# Patient Record
Sex: Female | Born: 1940 | Race: White | Hispanic: No
Health system: Western US, Academic
[De-identification: ages and names within clinical notes are randomized; demographics above are authoritative.]

## PROBLEM LIST (undated history)

## (undated) DIAGNOSIS — E119 Type 2 diabetes mellitus without complications: Secondary | ICD-10-CM

---

## 2017-10-26 ENCOUNTER — Encounter: Payer: Self-pay | Admitting: Emergency Medicine

## 2017-10-26 ENCOUNTER — Emergency Department (EMERGENCY_DEPARTMENT_HOSPITAL): Payer: Medicare Other

## 2017-10-26 ENCOUNTER — Emergency Department
Admission: EM | Admit: 2017-10-26 | Discharge: 2017-10-26 | Disposition: A | Discharge status: Home / Self Care | Payer: Medicare Other | Attending: Emergency Medicine | Admitting: Emergency Medicine

## 2017-10-26 DIAGNOSIS — R05 Cough: Secondary | ICD-10-CM

## 2017-10-26 DIAGNOSIS — H05012 Cellulitis of left orbit: Secondary | ICD-10-CM

## 2017-10-26 DIAGNOSIS — I4510 Unspecified right bundle-branch block: Secondary | ICD-10-CM

## 2017-10-26 DIAGNOSIS — S0512XA Contusion of eyeball and orbital tissues, left eye, initial encounter: Secondary | ICD-10-CM

## 2017-10-26 DIAGNOSIS — B309 Viral conjunctivitis, unspecified: Secondary | ICD-10-CM

## 2017-10-26 DIAGNOSIS — R9431 Abnormal electrocardiogram [ECG] [EKG]: Secondary | ICD-10-CM

## 2017-10-26 DIAGNOSIS — H5713 Ocular pain, bilateral: Principal | ICD-10-CM | POA: Insufficient documentation

## 2017-10-26 DIAGNOSIS — E119 Type 2 diabetes mellitus without complications: Secondary | ICD-10-CM | POA: Insufficient documentation

## 2017-10-26 HISTORY — DX: Type 2 diabetes mellitus without complications: E11.9

## 2017-10-26 LAB — BASIC METABOLIC PANEL
CALCIUM: 9.1 mg/dL (ref 8.6–10.5)
CARBON DIOXIDE TOTAL: 23 mmol/L — AB (ref 24–32)
CHLORIDE: 97 mmol/L (ref 95–110)
CREATININE BLOOD: 0.9 mg/dL (ref 0.44–1.27)
GLUCOSE: 99 mg/dL (ref 70–99)
POTASSIUM: 3.8 mmol/L (ref 3.3–5.0)
SODIUM: 133 mmol/L — AB (ref 135–145)
UREA NITROGEN, BLOOD (BUN): 9 mg/dL (ref 8–22)

## 2017-10-26 LAB — CBC WITH DIFFERENTIAL
BASOPHILS % AUTO: 0.5 %
Basophils Abs Auto: 0 10*3/uL (ref 0.0–0.2)
EOSINOPHIL % AUTO: 1.9 %
EOSINOPHIL ABS AUTO: 0.1 10*3/uL (ref 0.0–0.5)
HEMATOCRIT: 40.9 % (ref 36.0–46.0)
Hemoglobin: 13.8 g/dL (ref 12.0–16.0)
LYMPHOCYTES % AUTO: 18.3 %
Lymphocytes Abs Auto: 1.4 10*3/uL (ref 1.0–4.8)
MCH: 28.6 pg (ref 27.0–33.0)
MCHC: 33.7 % (ref 32.0–36.0)
MCV: 84.9 fL (ref 80.0–100.0)
MONOCYTES % AUTO: 9.9 %
MPV: 8.3 fL (ref 6.8–10.0)
Monocytes Abs Auto: 0.8 10*3/uL (ref 0.1–0.8)
NEUTROPHIL ABS AUTO: 5.4 10*3/uL (ref 1.8–7.7)
NEUTROPHILS % AUTO: 69.4 %
PLATELET COUNT: 170 10*3/uL (ref 130–400)
RDW: 13.6 % (ref 0.0–14.7)
RED CELL COUNT: 4.81 10*6/uL (ref 4.00–5.20)
WHITE BLOOD CELL COUNT: 7.8 10*3/uL (ref 4.5–11.0)

## 2017-10-26 MED ORDER — PROPARACAINE 0.5 % EYE DROPS
1.0000 [drp] | Freq: Once | OPHTHALMIC | Status: AC
Start: 2017-10-26 — End: 2017-10-26
  Administered 2017-10-26: 1 [drp] via OPHTHALMIC
  Filled 2017-10-26: qty 15

## 2017-10-26 MED ORDER — NEOMYCIN-POLYMYXIN-DEXAMETH 3.5 MG/ML-10,000 UNIT/ML-0.1% EYE DROPS
1.0000 [drp] | Freq: Four times a day (QID) | OPHTHALMIC | 0 refills | Status: AC
Start: 2017-10-26 — End: 2017-11-09

## 2017-10-26 MED ORDER — IOHEXOL 350 MG IODINE/ML INTRAVENOUS SOLUTION
100.0000 mL | INTRAVENOUS | Status: AC
Start: 2017-10-26 — End: 2017-10-26
  Administered 2017-10-26: 100 mL via INTRAVENOUS

## 2017-10-26 NOTE — ED Triage Note (Signed)
Pt has redness and swelling to L eye.  Pt was seen at OSH in [place] and diagnosed with cellulitis.  Pt here for second opinion.  Pt has had swelling and redness present in L eye for 6 days.  Pt denies further complaints at this time.

## 2017-10-26 NOTE — Consults (Addendum)
This ophthalmology evaluation is a preliminary consultation provided by the on-call resident on the ophthalmology consult service. The impression and recommendation will be finalized when the patient is re-examined by the on-call faculty.        Dilated at: 15:30    Ophthalmology Consultation:    Reason for consult: Orbital cellulitis    Requesting Attending: Cory Munch, *    Identification/Chief Complaint/History of Present Illness: Susan Vasquez is a 77yr old female with PMHx of diabetes and recurrent sinusitis who was sent from outside hospital for suspicion of left eye orbital cellulitis.    Patient reports that symptoms of eye pain and redness, with associated symptoms of fevers, chills and sweats began on Monday. Was seen on Wednesday at outside hospital and was given IV antibiotics for one day, then discharged with oral Augmentin and Bactrim. One day ago began having similar symptoms in the right eye. Vision preserved.    She states today that left eye has improved and right eye seems to be getting worse today.      Past Ocular History:  Refractive error     Past Medical History:   Diabetes Mellitus type 2  Recurrent sinusitis       Ocular Medications:  Moxifloxacin used for 4 days    Medications:  No current facility-administered medications on file prior to encounter.      No current outpatient medications on file prior to encounter.       Allergies:  No Known Allergies    Exam:  Ophthalmology Exam:   Visual Acuity:  OD: 20/25 cc near card  OS: 20/30+1 cc near card    Intraocular pressure  OD: 12 mmHg with tonopen  OS: 12 mmHg with tonopen   Pupils:    OD: 4--48mm, no RAPD   OS: 4---7mm, no RAPD    EOMs:  OD: Full  OS: Full   Confrontation VF:  OD: Full  OS: Full         Portable Slit Lamp Exam:      Exterior:  OD: Normal   OS: Normal   Lids/Lashes:  OD: Normal   OS: Normal    Conj/Sclera:  OD: 3+ injection, pseudomembrane, punctate palpebral conj bleeding, 2+ chemosis   OS: 1+ injection, 1+  chemosis    Cornea:  OD: Clear  OS: Clear   Anterior Chamber:  OD: Deep and formed  OS: Deep and formed   Iris:  OD: Round, reactive  OS: Round, reactive   Lens:  OD: Nuclear sclerosis  OS: Nuclear sclerosis    Vitreous:  OD: Clear  OS: Clear     Dilated Fundus Exam:      Disc:  OD: healthy rim, no notching, no splinter heme, no edema  OS: healthy rim, no notching, no splinter heme, no edema   Macula:  OD: Normal   OS: Normal    Vessels:  OD: Normal  OS: Normal   Periphery:  OD: Normal   OS: Normal     Images:  CT sinus/face:    1. Mild left periorbital preseptal soft tissue swelling, which could  represent cellulitis in the appropriate clinical setting.   2. No drainable enhancing fluid collection to suggest abscess. No  evidence of orbital cellulitis or retrobulbar edema.    1. Assessment:  # Viral conjunctivitis, both eyes  - Mild Itching, burning, tearing, FBS, starting unilaterally  - Periauricular lymph nodes are tender, reports fever and chills  - Eyelids are  red  and edematous  - SLE  showing  pseudomembranes.    Plan:  - Consider Maxitrol eye drops QID to both eyes  - Continue with oral Augmentin and discontinue Bactrim  - Recommend preservative free tears 4-8 times/day for 3 weeks  - Explain that viral conjunctivitis is highly contagious (~10-12 days from onset). Wash hands and avoid contact spread if possible.    -Follow-up 1-2 weeks with ophthalmologist but sooner if condition significantly worsens.    Patient was seen with attending on staff. Please see attending's note for final assessment and plan.      Iline Oven, M.D.   Resident Physician, PGY-2  [facility]  PI: [phone]  Service Pager: [phone]  Personal Pager: [phone]    I interviewed and examined this patient and agree with above plan.  R Elpidio Galea MD  [employer] Fellow  Pager [phone]

## 2017-10-26 NOTE — ED Nursing Note (Signed)
Assumed care of pt, report received from Anabelle, RN. Pt awake, alert and calm. Family at bedside. Pt c/o 5/10 pain in left eye, no c/o pain in right eye, pt states it just burns.  NAD noted at this time

## 2017-10-26 NOTE — ED Provider Notes (Addendum)
EMERGENCY DEPARTMENT PHYSICIAN NOTE - Susan Vasquez       Date of Service:   10/26/2017  9:49 AM Patient's PCP: Patient, No Pcp Per   Note Started: 10/26/2017 13:31 DOB: 03/25/1941         Chief Complaint   Patient presents with    Eye Pain, Redness with Minor FB     cellulitis to eyes       The history provided by the patient.  Interpreter used: No    CINDE EBERT is a 77yr old female, who has a past medical history significant for DM, presenting to the ED with a chief complaint of eye pain.  Patient states her symptoms started 5 days ago.  Had a cough and sinus congestion.  Noticed irritation in the left eye.  Over the next few days the left eye irritation continue to worsen and is now having worsening pain with eye movement and photophobia.  Seen at [facility] yesterday where CT findings were reportedly concerning for orbital cellulitis.  Patient was given antibiotics and told to go to [facility] today for further evaluation by ophthalmology.      A full history, including past medical, social, and family history (as detailed in this note), was reviewed and updated as necessary.      HISTORY:  No past medical history on file. No Known Allergies   No past surgical history on file. No current outpatient medications on file.   Social History     Tobacco Use    Smoking status: Not on file   Substance Use Topics    Alcohol use: Not on file    Drug use: Not on file     Social History     Social History Narrative    Not on file    No family history on file.        Review of Systems   Constitutional: Positive for chills and fever.   HENT: Positive for congestion, rhinorrhea, sinus pressure and sinus pain. Negative for sore throat.    Eyes: Positive for pain and redness.   Respiratory: Positive for cough. Negative for chest tightness and shortness of breath.    Cardiovascular: Negative for chest pain and palpitations.   Gastrointestinal: Negative for abdominal pain, constipation, diarrhea, nausea and  vomiting.   Genitourinary: Negative for dysuria, flank pain and pelvic pain.   Musculoskeletal: Negative for arthralgias, back pain, neck pain and neck stiffness.   Skin: Negative for rash.   Neurological: Negative for dizziness, weakness, light-headedness and headaches.   Psychiatric/Behavioral: Negative for confusion.          TRIAGE VITAL SIGNS:  Temp: 36.9 C (98.4 F) (10/26/17 0955)  Temp src: Oral (10/26/17 0955)  Pulse: 81 (10/26/17 0955)  BP: 128/76 (10/26/17 0955)  Resp: 14 (10/26/17 0955)  SpO2: 97 % (10/26/17 0955)  Weight: 88.5 kg (195 lb 1.7 oz) (10/26/17 0955)    Physical Exam   Constitutional: She is oriented to person, place, and time. She appears well-developed and well-nourished.   HENT:   Head: Normocephalic and atraumatic.   Mouth/Throat: Oropharynx is clear and moist.   Eyes: Pupils are equal, round, and reactive to light. EOM are normal. Right eye exhibits chemosis. Left eye exhibits chemosis and discharge. Right conjunctiva is injected. Left conjunctiva is injected.   OS 20/25  OD 20/25  OU 20/25   Neck: Normal range of motion. Neck supple.   Cardiovascular: Normal rate and regular rhythm.   Pulmonary/Chest:  Effort normal and breath sounds normal.   Abdominal: Soft. Bowel sounds are normal. She exhibits no mass. There is no tenderness. There is no rebound.   Musculoskeletal: Normal range of motion. She exhibits no edema.   Neurological: She is alert and oriented to person, place, and time.   Skin: Skin is warm and dry. Capillary refill takes less than 2 seconds. No rash noted.   Psychiatric: She has a normal mood and affect.   Nursing note and vitals reviewed.         INITIAL ASSESSMENT & PLAN, MEDICAL DECISION MAKING, ED COURSE  JANAUTICA NETZLEY is a 77yr female who presents with a chief complaint of left eye pain.     Differential includes, but is not limited to: Orbital cellulitis, preseptal cellulitis, sinus infection, uveitis, keratitis, glaucoma, foreign body, chalazion     The  results of the ED evaluation were notable for the following:     Pertinent lab results:   Labs Reviewed   BASIC METABOLIC PANEL - Abnormal       Result Value    Sodium 133 (*)     Potassium 3.8      Chloride 97      Carbon Dioxide Total 23 (*)     Urea Nitrogen, Blood (BUN) 9      Creatinine Serum 0.90      Glucose 99      Calcium 9.1      E-GFR, African American >60      E-GFR, Non-African American >60     CBC WITH DIFFERENTIAL    White Blood Cell Count 7.8      Red Blood Cell Count 4.81      Hemoglobin 13.8      Hematocrit 40.9      MCV 84.9      MCH 28.6      MCHC 33.7      RDW 13.6      MPV 8.3      Platelet Count 170      Neutrophils % Auto 69.4      Lymphocytes % Auto 18.3      Monocytes % Auto 9.9      Eosinophil % Auto 1.9      Basophils % Auto 0.5      Neutrophil Abs Auto 5.4      Lymphocyte Abs Auto 1.4      Monocytes Abs Auto 0.8      Eosinophil Abs Auto 0.1      Basophils Abs Auto 0.0         Pertinent imaging results (reviewed and interpreted independently by me):   DX CHEST 1 VIEW  CT SINUS / FACIAL WITH CONTRAST    Radiology reads:   Ct Sinus / Facial With Contrast    Result Date: 10/26/2017  CT SINUS / FACIAL WITH CONTRAST EXAM DATE: 10/26/2017 3:02 PM. COMPARISON: None INDICATION: Concern for orbital cellulitis TECHNIQUE: 1.25 mm Axial CT images of the face were obtained in bone and soft tissue algorithm. Coronal and sagittal reformatted images were also examined. DOSE REPORT: This study involved (1) CT acquisition(s).  The CTDIvol and DLP values are included below as required by state law: 1; Series: 2; Facial Bones; 16 cm; CTDIvol=22.8 mGy;  DLP 411.4 mGy-cm For further information on CT radiation dose, see [email or web address]-fox.[email or web address]/ FINDINGS: Facial fractures: None. 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 Orbits: Unremarkable, specifically no retrobulbar soft tissue fat stranding or fluid collections. Paranasal Sinuses and  mastoid air cells:  Clear. Soft tissues: Mild left periorbital soft tissue swelling. Visualized brain parenchyma: No significant abnormality is noted. IMPRESSION: 1. Mild left periorbital soft tissue swelling, which could represent periorbital cellulitis in the appropriate clinical setting. No evidence of orbital cellulitis or retrobulbar fluid collection. *THIS STUDY HAS NOT BEEN REVIEWED BY AN ATTENDING RADIOLOGIST* Preliminary Report Electronically Signed By: Laurena Slimmer Kipper on 10/26/2017 3:06 PM      EKG (reviewed and interpreted independently by me):   Sinus, rate 65, right bundle branch block, no ST segment elevations, T wave inversion and V2 and V3.  Normal sinus rhythm with a right bundle branch block and nonspecific ST segment changes.    ED Medication Administration through 10/26/2017 1516     Date/Time Order Dose Route Action    10/26/2017 1502 Iohexol (OMNIPAQUE) 350 mg/mL Injection 100 mL 100 mL IV Given          Chart Review: I reviewed the patient's prior medical records. Pertinent information that is relevant to this encounter: No records for comparison.          PATIENT SUMMARY   77 year old female with a past medical history significant for diabetes presents emergency department with chief complaint of eye pain.  Vital signs within normal limits on initial assessment.  Physical exam significant for bilateral chemosis and clear discharge from both eyes. E/o conjunctivitis and periorbital cellulitis bilaterally.  Records from the outside hospital unavailable at time of evaluation.  Review of records request sent to [facility] in [place]  CT demonstrating mild left periorbital soft tissue swelling concerning for potential periorbital cellulitis but no obvious evidence of orbital cellulitis.  Ophthalmology consulted.  Recommendations pending at time of signout.  Patient was signed out to the oncoming team will follow up with ophthalmology recommendations.    Dispo pending ophthalmology evaluation.          LAST VITAL  SIGNS:  Temp: 36.7 C (98.1 F) (10/26/17 1246)  Temp src: Oral (10/26/17 1246)  Pulse: 67 (10/26/17 1246)  BP: 110/69 (10/26/17 1246)  Resp: 18 (10/26/17 1246)  SpO2: 96 % (10/26/17 1246)  Weight: 88.5 kg (195 lb 1.7 oz) (10/26/17 0955)      Clinical Impression:   Bilateral eye pain      Disposition: Pending      PATIENT'S GENERAL CONDITION:  Fair: Vital signs are stable and within normal limits. Patient is conscious but may be uncomfortable. Indicators are favorable.      Electronically signed by: Niel Hummer, DO, Resident         This patient was seen, evaluated, and care plan was developed with the resident.  I agree with the findings and plan as outlined in our combined note.    Cory Munch, MD      Electronically signed by: Cory Munch, MD, Attending Physician

## 2017-10-26 NOTE — ED Nursing Note (Signed)
EKG obtained by [facility] ED tech, pt asked to change into gown for imaging.

## 2017-10-26 NOTE — ED Nursing Note (Signed)
Assumed care, respirations even and unlabored, NAD. Pt ambulated to gurney with steady gait. MD at bedside.

## 2017-10-26 NOTE — ED Nursing Note (Signed)
Report given to Masiel RN, care transferred.

## 2017-10-26 NOTE — Discharge Instructions (Signed)
You have a viral conjunctivitis  Wash your hands very frequently! It is highly contagious.    Continue Augmentin but stop the Bactrim.    We recommend Preservative Free eye drops for comfort  You can also use the prescribed eye drop 4x per day in both eyes

## 2017-10-26 NOTE — ED Nursing Note (Signed)
Patient discharged from ED with AVS, Rx, related instructions and all belongings. Patient is in NAD, is awake/alert skin, is pink/warm/dry. Pt leaves the ED ambulatory with family.

## 2017-10-26 NOTE — ED Nursing Note (Signed)
Tonopen at bedside, notified Elnita Maxwell RN that tonopen came from B pod. Doyne RN states she will return it once MD is finished with tonopen.

## 2017-10-26 NOTE — ED Nursing Note (Signed)
Pt ambulatory to revital station in [place] with steady gait, NAD, calm and cooperative at this time.

## 2017-10-28 LAB — ELECTROCARDIOGRAM WITH RHYTHM STRIP: QTC: 459

## 2020-08-04 IMAGING — CR CHEST
2 series · 2 of 2 positions shown · non-contrast
Comparison: none

[chest pa]
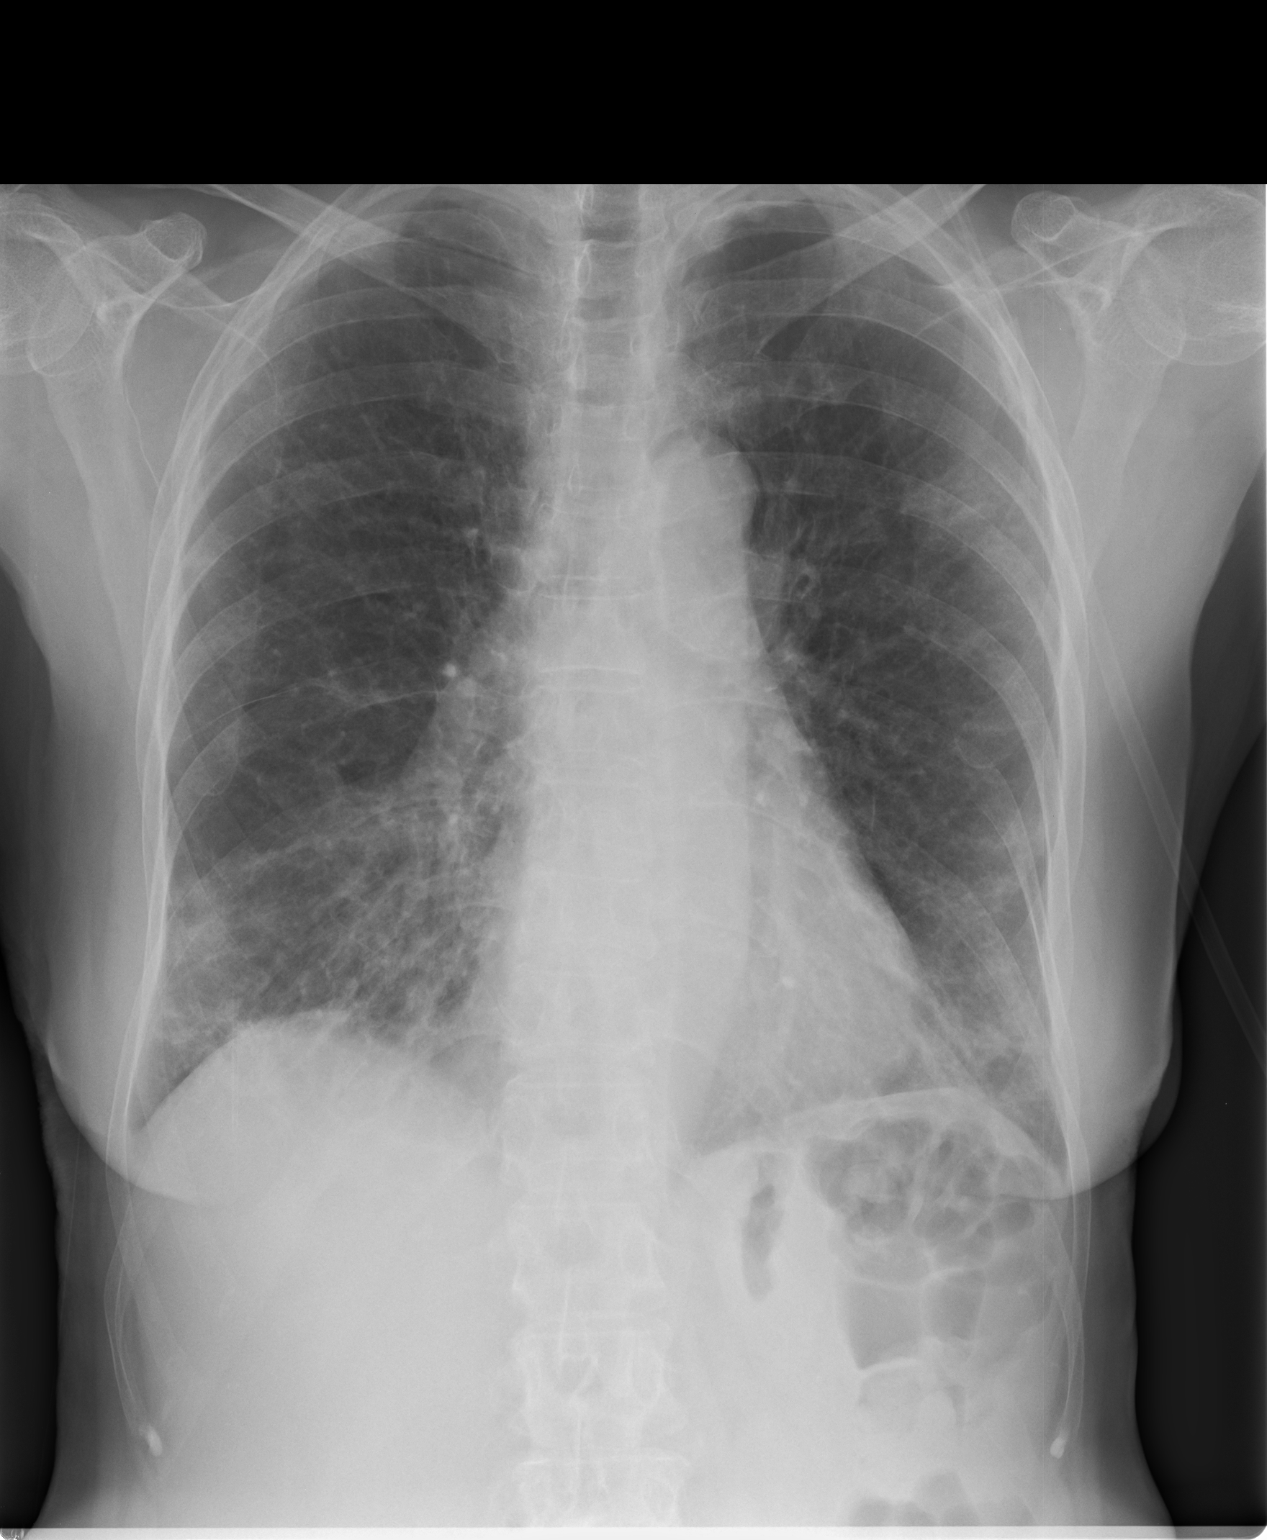

[chest lat]
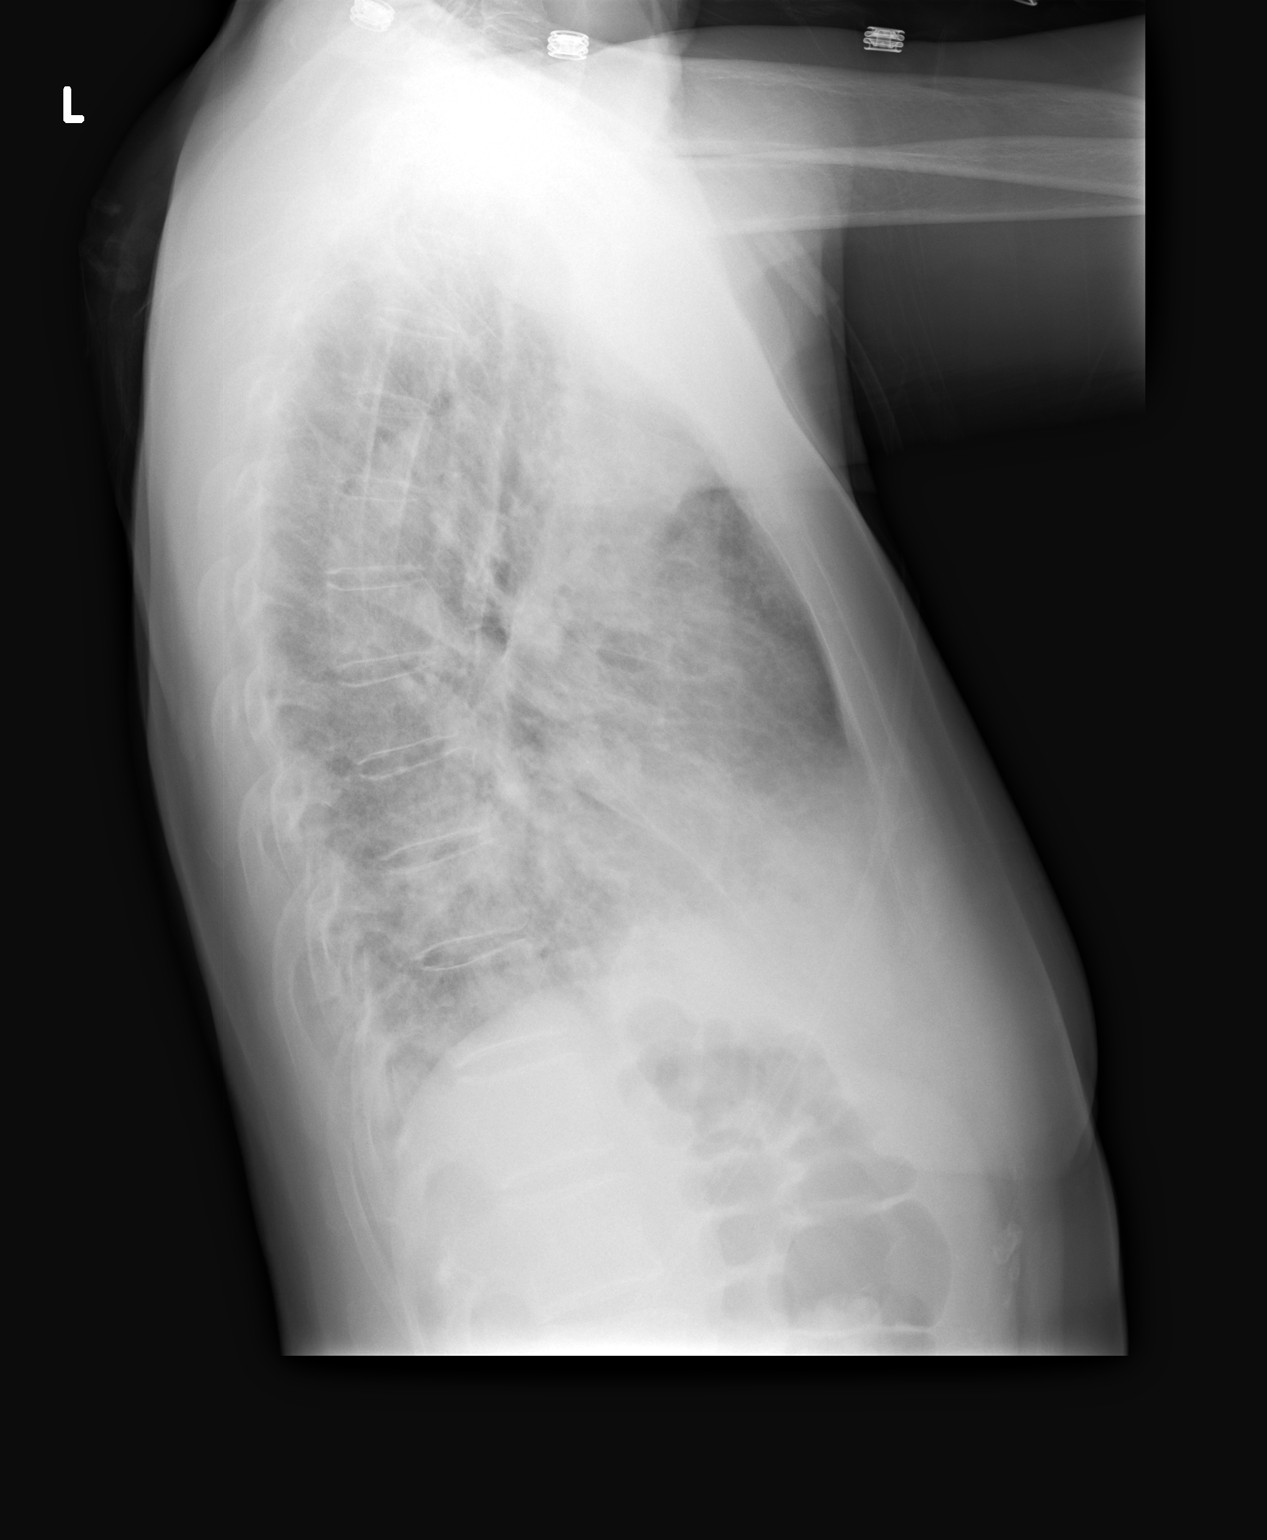

[2 of 2 positions shown; findings below may reference images not displayed]

EXAM

Chest x-ray 2 view.

INDICATION

HYPOEXEMIA  COVID POS
SOA, DYSPNEA, PT STATES SHE FEELS BETTER, NOW ON 1 L O2.  COVID POSITIVE.  RG

FINDINGS

The prior study was reviewed from 05/26/2019.

There are scattered coarse interstitial and patchy ground-glass pulmonary opacities in the
periphery of the mid to lower portion of each lung, slightly increased since prior study per there
is no pneumothorax.

The heart size and pulmonary vascular are normal.  There is no acute bone lesion.

IMPRESSION

There has been a slight increase and coarse interstitial and ground-glass opacities in the
periphery of the mid to lower portion both lungs, most likely representing slightly worsening
pneumonia.  There is no pneumothorax.

Tech Notes:

SOA, DYSPNEA, PT STATES SHE FEELS BETTER, NOW ON 1 L O2.  COVID POSITIVE.  RG

## 2021-09-24 IMAGING — MG MAMMOGRAM, DIGITAL SCREEN BILA
1 series · 4 of 4 positions shown · non-contrast
Comparison: none

[Series 2: R CC · right · 4 of 4 slices shown]
[im 1/4]
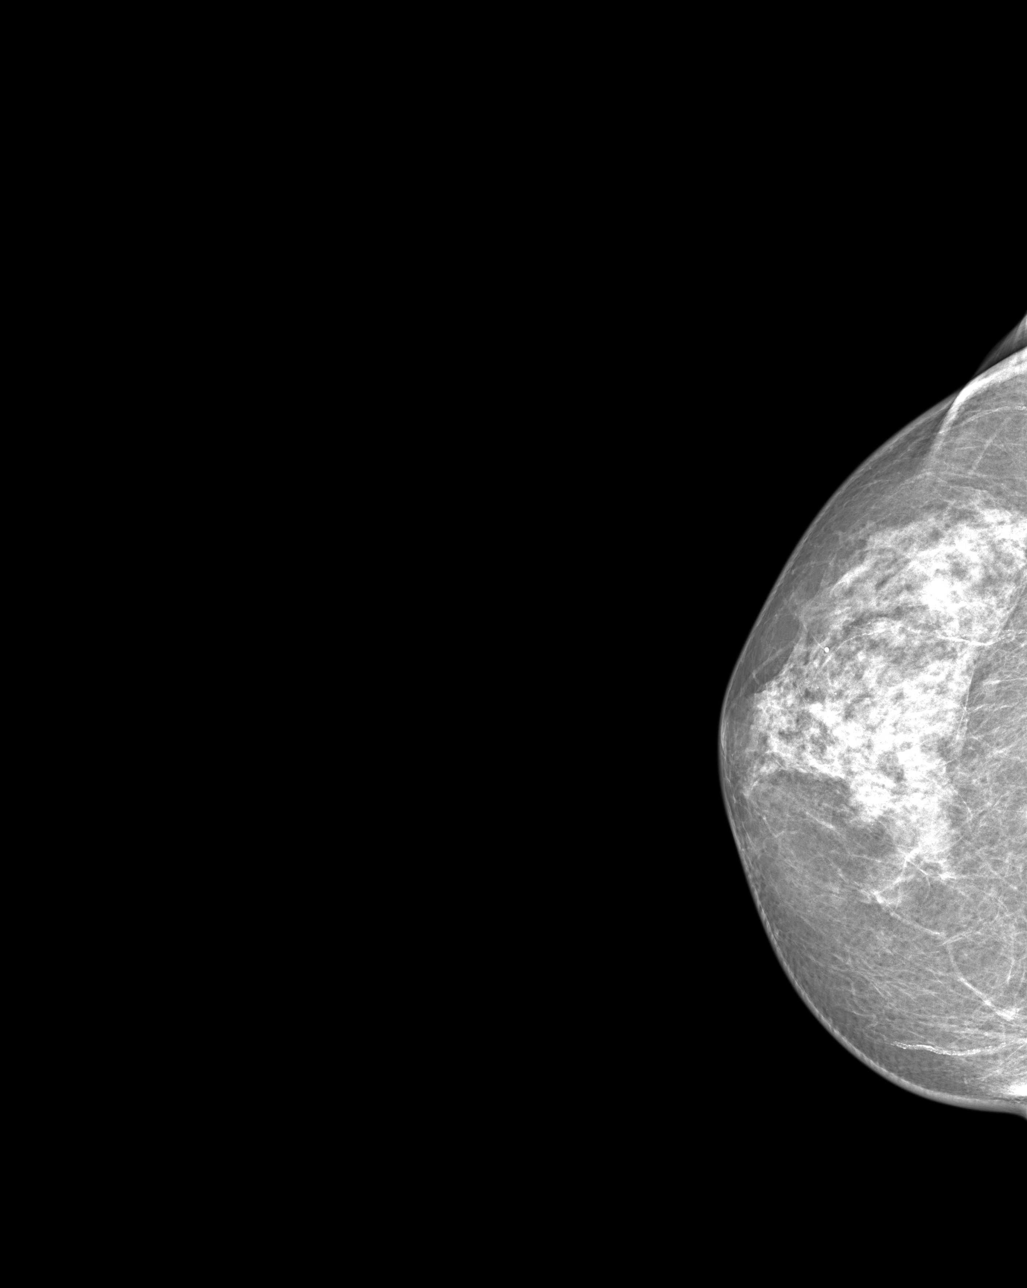
[im 2/4]
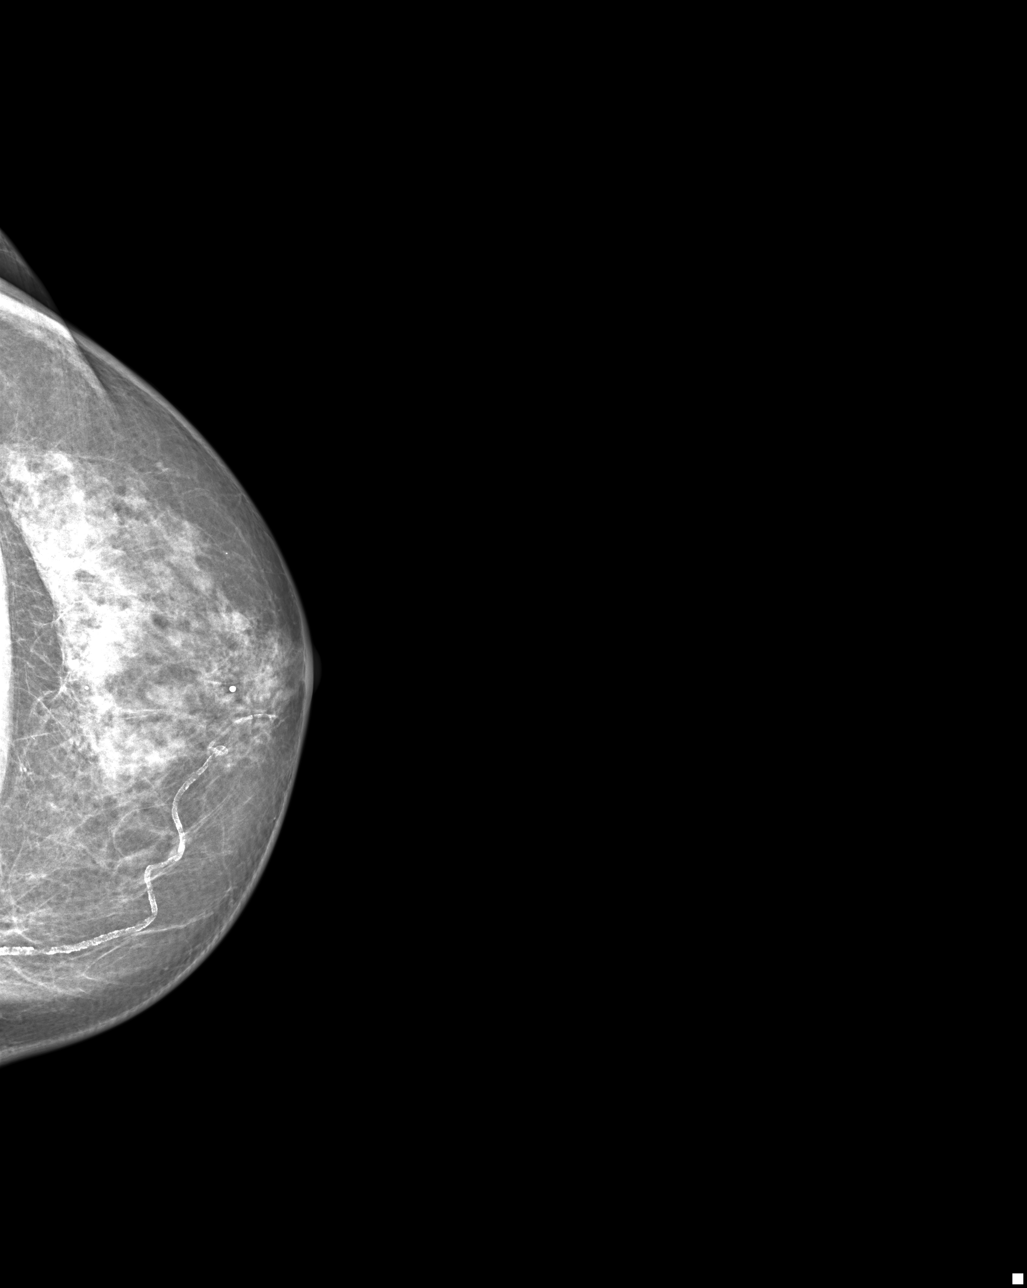
[im 3/4]
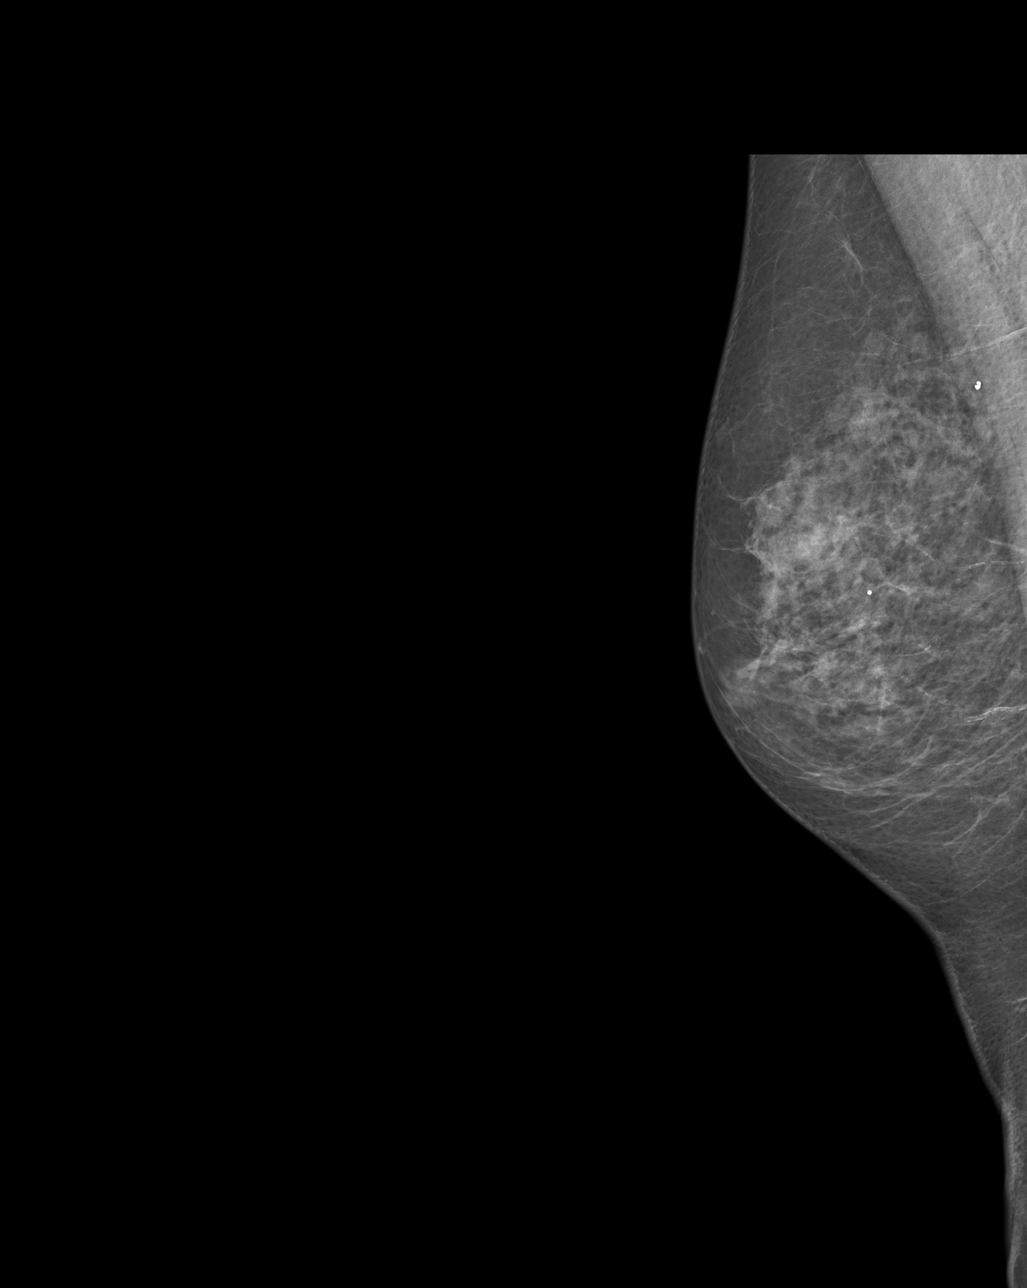
[im 4/4]
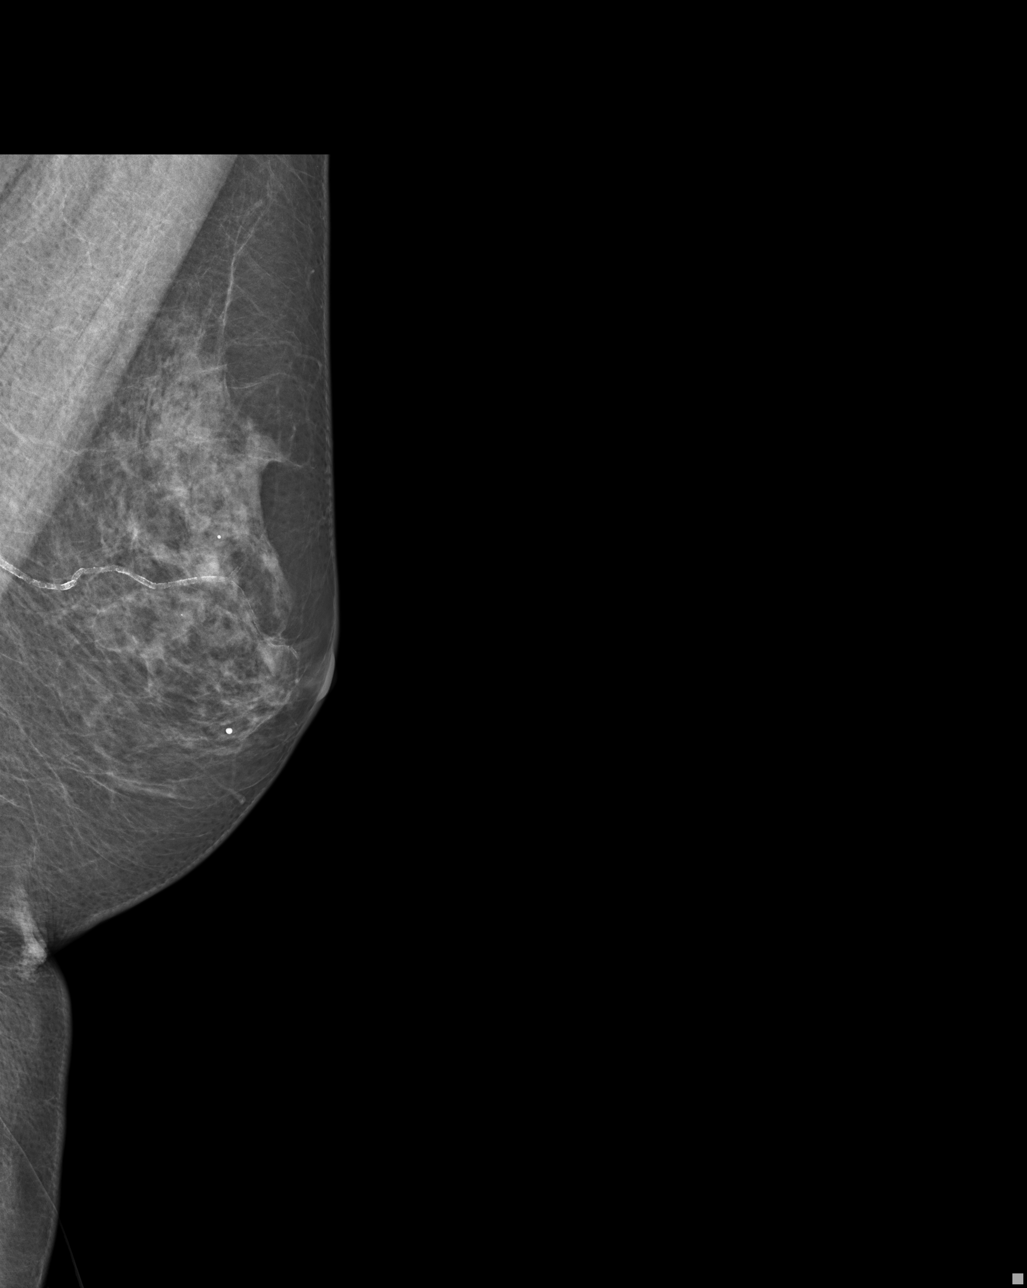

[4 of 4 positions shown; findings below may reference images not displayed]

EXAM

BILATERAL DIGITAL SCREENING MAMMOGRAM, CPT A0000; WITH CAD

INDICATION

screening
SCR 2D. PT CHOICE. NO PROBLEMS. LM

TECHNIQUE

Bilateral digital craniocaudal and mediolateral oblique views were generated and reviewed with
computer-aided detection.

COMPARISONS

Previous examination dated 07/15/2019.

FINDINGS

The breasts are heterogeneously dense. This limits the sensitivity of mammography. There are no
suspicious clusters of microcalcifications. There are no suspicious spiculated masses.

There is no evidence of skin thickening and retraction. There is no evidence of axillary
lymphadenopathy. Benign calcifications are identified.

IMPRESSION

No localizing signs of malignancy. Stable appearance of the breast.

BI-RADS 2, BENIGN.

A reminder letter will be sent.

A twelve month screening mammogram is recommended and a follow-up letter will be scheduled.

Computer Aided Detection was utilized for this interpretation.

A. A negative x-ray report should not delay biopsy if a dominant or clinically suspicious mass is
present.

B. A small percentage (probably less than 10%) of cancers will not be identified by x-ray, and
there will be a similar small percentage of false positive reports.

C. Adenosis and dense breasts may obscure an underlying neoplasm.

Tech Notes:
